# Patient Record
Sex: Female | Born: 1980 | Hispanic: Yes | Marital: Single | Smoking: Never smoker
Health system: Southern US, Community
[De-identification: ages and names within clinical notes are randomized; demographics above are authoritative.]

## PROBLEM LIST (undated history)

## (undated) DIAGNOSIS — B999 Unspecified infectious disease: Secondary | ICD-10-CM

## (undated) DIAGNOSIS — O24419 Gestational diabetes mellitus in pregnancy, unspecified control: Secondary | ICD-10-CM

## (undated) HISTORY — PX: NO PAST SURGERIES: SHX2092

## (undated) HISTORY — DX: Gestational diabetes mellitus in pregnancy, unspecified control: O24.419

---

## 2013-09-25 LAB — CHG HEMOGLOBIN ELECTROPHORESIS
GLUCOSE 1 HOUR GTT: 120 mg/dL (ref ?–200)
Hemoglobin, Elp: NORMAL
PAP SMEAR: NEGATIVE
QUAD SCREEN FOR MFM: NEGATIVE
Urine Culture, OB: NEGATIVE

## 2013-09-25 LAB — OB RESULTS CONSOLE HEPATITIS B SURFACE ANTIGEN: Hepatitis B Surface Ag: NEGATIVE

## 2013-09-25 LAB — OB RESULTS CONSOLE ABO/RH: RH Type: POSITIVE

## 2013-09-25 LAB — OB RESULTS CONSOLE HIV ANTIBODY (ROUTINE TESTING): HIV: NONREACTIVE

## 2013-09-25 LAB — OB RESULTS CONSOLE VARICELLA ZOSTER ANTIBODY, IGG: VARICELLA IGG: IMMUNE

## 2013-09-25 LAB — OB RESULTS CONSOLE HGB/HCT, BLOOD
HEMATOCRIT: 40 %
Hemoglobin: 12.6 g/dL

## 2013-09-25 LAB — OB RESULTS CONSOLE RPR: RPR: NONREACTIVE

## 2013-09-25 LAB — OB RESULTS CONSOLE GC/CHLAMYDIA
CHLAMYDIA, DNA PROBE: NEGATIVE
GC PROBE AMP, GENITAL: NEGATIVE

## 2013-09-25 LAB — OB RESULTS CONSOLE RUBELLA ANTIBODY, IGM: RUBELLA: IMMUNE

## 2013-09-25 LAB — OB RESULTS CONSOLE ANTIBODY SCREEN: Antibody Screen: NEGATIVE

## 2013-12-27 LAB — GLUCOSE TOLERANCE, 1 HOUR: GLUCOSE 1 HOUR GTT: 151 mg/dL (ref ?–200)

## 2014-01-03 ENCOUNTER — Encounter: Payer: Self-pay | Admitting: Obstetrics & Gynecology

## 2014-01-03 LAB — GLUCOSE, FASTING
GLUCOSE FASTING: 102 mg/dL (ref 60–109)
Glucose, GTT - 1 Hour: 216 mg/dL — AB (ref ?–200)

## 2014-01-09 DIAGNOSIS — O24419 Gestational diabetes mellitus in pregnancy, unspecified control: Secondary | ICD-10-CM

## 2014-01-09 NOTE — Progress Notes (Signed)
Called [facility] department to get ultrasound reports .

## 2014-01-15 ENCOUNTER — Ambulatory Visit (INDEPENDENT_AMBULATORY_CARE_PROVIDER_SITE_OTHER): Payer: Self-pay | Admitting: Obstetrics & Gynecology

## 2014-01-15 ENCOUNTER — Encounter: Payer: Self-pay | Admitting: Obstetrics & Gynecology

## 2014-01-15 ENCOUNTER — Encounter: Payer: Self-pay | Attending: Obstetrics & Gynecology | Admitting: *Deleted

## 2014-01-15 VITALS — BP 132/78 | HR 95 | Ht <= 58 in | Wt 163.3 lb

## 2014-01-15 DIAGNOSIS — Z713 Dietary counseling and surveillance: Secondary | ICD-10-CM | POA: Insufficient documentation

## 2014-01-15 DIAGNOSIS — O24419 Gestational diabetes mellitus in pregnancy, unspecified control: Secondary | ICD-10-CM

## 2014-01-15 LAB — POCT URINALYSIS DIP (DEVICE)
Bilirubin Urine: NEGATIVE
GLUCOSE, UA: 100 mg/dL — AB
KETONES UR: NEGATIVE mg/dL
NITRITE: NEGATIVE
Protein, ur: NEGATIVE mg/dL
Specific Gravity, Urine: 1.01 (ref 1.005–1.030)
Urobilinogen, UA: 0.2 mg/dL (ref 0.0–1.0)
pH: 6.5 (ref 5.0–8.0)

## 2014-01-15 NOTE — Progress Notes (Signed)
Nutrition note: 1st visit consult & GDM diet education Pt is newly diagnosed with GDM & has h/o obesity Pt has gained 13.3# @ 2067w5d, which is wnl. Pt reports eating 3 meals & 3 snacks/d. Pt is taking a PNV. Pt reports no N/V or heartburn. NKFA. Pt reports walking 30-60 mins most days. Pt received verbal & written education on GDM diet in Spanish via interpreter. Discussed benefits of BF. Discussed wt gain goals of 11-20# or 0.5#/wk. Pt agrees to follow GDM diet with 3 meals & 3 snacks/d with proper CHO/ protein combination. Pt has WIC & plans to BF. F/u in 4-6 wks Blondell RevealLaura Amillion Macchia, MS, RD, LDN, [facility]

## 2014-01-15 NOTE — Progress Notes (Signed)
Patient is Spanish-speaking only, Spanish interpreter present for this encounter. [facility]. Discussed implications of DM in pregnancy, need for optimizing glycemic control to decrease DM associated maternal-fetal morbidity and mortality, need for antenatal testing and frequent ultrasounds/prenatal visits. Will check baseline labs today, get DM education and DM testing supplies today. No other complaints or concerns.  Labor and fetal movement precautions reviewed.

## 2014-01-15 NOTE — Progress Notes (Signed)
  Patient was seen on 01/15/14 for Gestational Diabetes self-management . The following learning objectives were met by the patient :   States the definition of Gestational Diabetes  States why dietary management is important in controlling blood glucose  States when to check blood glucose levels  Demonstrates proper blood glucose monitoring techniques  States the effect of stress and exercise on blood glucose levels  Plan:  Consider  increasing your activity level by walking daily as tolerated Begin checking BG before breakfast and 2 hours after first bit of breakfast, lunch and dinner after  as directed by MD  Take medication  as directed by MD  Blood glucose monitor given: [employer] Track Lot # [id number] Exp: 2016/03/14 Blood glucose reading: FBS $RemoveBefo'118mg'[id number]$ /dl  Patient instructed to monitor glucose levels: FBS: 60 - <90 2 hour: <120  Patient received the following handouts:  Nutrition Diabetes and Pregnancy  Patient will be seen for follow-up as needed.

## 2014-01-15 NOTE — Patient Instructions (Signed)
Regrese a la clinica cuando tenga su cita. Si tiene problemas o preguntas, llama a la clinica o vaya a la sal[facility] al [facility].    

## 2014-01-30 ENCOUNTER — Encounter: Payer: Self-pay | Admitting: Obstetrics and Gynecology

## 2014-01-30 ENCOUNTER — Ambulatory Visit (INDEPENDENT_AMBULATORY_CARE_PROVIDER_SITE_OTHER): Payer: Self-pay | Admitting: Obstetrics and Gynecology

## 2014-01-30 VITALS — BP 124/65 | HR 83 | Temp 98.1°F | Wt 161.7 lb

## 2014-01-30 DIAGNOSIS — O09513 Supervision of elderly primigravida, third trimester: Secondary | ICD-10-CM

## 2014-01-30 DIAGNOSIS — O24419 Gestational diabetes mellitus in pregnancy, unspecified control: Secondary | ICD-10-CM

## 2014-01-30 DIAGNOSIS — O09519 Supervision of elderly primigravida, unspecified trimester: Secondary | ICD-10-CM | POA: Insufficient documentation

## 2014-01-30 DIAGNOSIS — E669 Obesity, unspecified: Secondary | ICD-10-CM

## 2014-01-30 LAB — POCT URINALYSIS DIP (DEVICE)
Bilirubin Urine: NEGATIVE
Glucose, UA: NEGATIVE mg/dL
KETONES UR: NEGATIVE mg/dL
LEUKOCYTES UA: NEGATIVE
Nitrite: NEGATIVE
Protein, ur: NEGATIVE mg/dL
Specific Gravity, Urine: <=1.005 (ref 1.005–1.030)
Urobilinogen, UA: 0.2 mg/dL (ref 0.0–1.0)
pH: 5 (ref 5.0–8.0)

## 2014-01-30 MED ORDER — GLYBURIDE 2.5 MG PO TABS: ORAL_TABLET | ORAL | Status: DC

## 2014-01-30 NOTE — Progress Notes (Signed)
Ultrasound scheduled with MFM on Friday 02/02/14 at 0815.

## 2014-01-30 NOTE — Progress Notes (Signed)
Interpreter here. Records reviewed. [facility] Had 1 hr GCT at [facility] in July of 120. Recent 1 hr  GCT was 215.  Following [facility] restricted diet and qid CBG checks since 01/15/14: All fastings elevated except 1 (86-140, most 110-120). Postparandials mostly within range 91-122 with outlyer x2 (170, 143 after dinner). Discussed initiating glyburide with Dr. Jolayne Pantheronstant> start 1.25 mg qhs. Complete US with BPP. Friday and start 2x/wk fetal testing next week. F/U Monday with DM nurse. Reviewed diet.

## 2014-01-30 NOTE — Progress Notes (Signed)
Cassie RedoAdriana Zavala Thornton used as interpreter for this encounter.

## 2014-02-02 ENCOUNTER — Encounter (HOSPITAL_COMMUNITY): Payer: Self-pay | Admitting: Obstetrics and Gynecology

## 2014-02-02 ENCOUNTER — Ambulatory Visit (HOSPITAL_COMMUNITY)
Admission: RE | Admit: 2014-02-02 | Discharge: 2014-02-02 | Disposition: A | Discharge status: Home / Self Care | Payer: Self-pay | Source: Ambulatory Visit | Attending: Obstetrics and Gynecology | Admitting: Obstetrics and Gynecology

## 2014-02-02 VITALS — BP 147/79 | HR 84 | Wt 164.2 lb

## 2014-02-02 DIAGNOSIS — O24419 Gestational diabetes mellitus in pregnancy, unspecified control: Secondary | ICD-10-CM

## 2014-02-02 DIAGNOSIS — O24410 Gestational diabetes mellitus in pregnancy, diet controlled: Secondary | ICD-10-CM | POA: Insufficient documentation

## 2014-02-02 DIAGNOSIS — O09513 Supervision of elderly primigravida, third trimester: Secondary | ICD-10-CM

## 2014-02-02 DIAGNOSIS — Z3A32 32 weeks gestation of pregnancy: Secondary | ICD-10-CM | POA: Insufficient documentation

## 2014-02-02 LAB — US OP OB COMP + 14 WK

## 2014-02-05 ENCOUNTER — Ambulatory Visit (INDEPENDENT_AMBULATORY_CARE_PROVIDER_SITE_OTHER): Payer: Self-pay | Admitting: Obstetrics & Gynecology

## 2014-02-05 VITALS — BP 129/73 | HR 96 | Temp 98.0°F | Wt 162.2 lb

## 2014-02-05 DIAGNOSIS — O24419 Gestational diabetes mellitus in pregnancy, unspecified control: Secondary | ICD-10-CM

## 2014-02-05 LAB — POCT URINALYSIS DIP (DEVICE)
BILIRUBIN URINE: NEGATIVE
GLUCOSE, UA: 100 mg/dL — AB
Nitrite: NEGATIVE
Protein, ur: 30 mg/dL — AB
SPECIFIC GRAVITY, URINE: 1.025 (ref 1.005–1.030)
Urobilinogen, UA: 0.2 mg/dL (ref 0.0–1.0)
pH: 6.5 (ref 5.0–8.0)

## 2014-02-05 NOTE — Progress Notes (Signed)
Fastings 102,100,98,92,90,90,90;  All pp are less than 100.  Pt encouraged to take a walk and eat bedtime snack.  If fastings consistently above 90 will increase to 2.5 mg of glyburide.   Will send Urine culture for hgb, leuks and protein in urine.

## 2014-02-05 NOTE — Addendum Note (Signed)
Addended by: Jill SideAY, DIANE L on: 02/05/2014 12:09 PM   Modules accepted: Level of Service

## 2014-02-05 NOTE — Progress Notes (Addendum)
Nutrition note: f/u GDM diet Pt has gained 12.2# @ 636w6d, which is wnl. Pt's BS- fasting: 90-100, 2hr pp: 87-102. Pt reports eating her last snack ~7pm & has 1 cup of milk, piece of fruit & piece of bread. Explained to pt that she should try to only consume 2 servings of CHO at her snack before bedtime. Reviewed CHO foods vs non-CHO foods. Pt agreeable. F/u as needed Cassie Thornton RevealLaura Annajulia Lewing, MS, RD, LDN, [facility]

## 2014-02-05 NOTE — Progress Notes (Signed)
Pt had US w/ BPP 8/8 on 12/4.

## 2014-02-08 ENCOUNTER — Other Ambulatory Visit: Payer: Self-pay | Admitting: Obstetrics & Gynecology

## 2014-02-08 ENCOUNTER — Ambulatory Visit (INDEPENDENT_AMBULATORY_CARE_PROVIDER_SITE_OTHER): Payer: Self-pay | Admitting: *Deleted

## 2014-02-08 VITALS — BP 126/79 | HR 76

## 2014-02-08 DIAGNOSIS — O24419 Gestational diabetes mellitus in pregnancy, unspecified control: Secondary | ICD-10-CM

## 2014-02-08 LAB — US OB FOLLOW UP

## 2014-02-08 MED ORDER — AMOXICILLIN 500 MG PO CAPS: 500.0000 mg | ORAL_CAPSULE | Freq: Three times a day (TID) | ORAL | Status: AC

## 2014-02-09 LAB — CULTURE, OB URINE

## 2014-02-12 ENCOUNTER — Encounter: Payer: Self-pay | Attending: Obstetrics & Gynecology | Admitting: *Deleted

## 2014-02-12 ENCOUNTER — Encounter: Payer: Self-pay | Admitting: *Deleted

## 2014-02-12 ENCOUNTER — Ambulatory Visit (INDEPENDENT_AMBULATORY_CARE_PROVIDER_SITE_OTHER): Payer: Self-pay | Admitting: Obstetrics & Gynecology

## 2014-02-12 VITALS — BP 138/83 | HR 98 | Wt 164.5 lb

## 2014-02-12 DIAGNOSIS — O24419 Gestational diabetes mellitus in pregnancy, unspecified control: Secondary | ICD-10-CM | POA: Insufficient documentation

## 2014-02-12 DIAGNOSIS — Z713 Dietary counseling and surveillance: Secondary | ICD-10-CM | POA: Insufficient documentation

## 2014-02-12 LAB — POCT URINALYSIS DIP (DEVICE)
BILIRUBIN URINE: NEGATIVE
GLUCOSE, UA: NEGATIVE mg/dL
Ketones, ur: NEGATIVE mg/dL
NITRITE: NEGATIVE
Protein, ur: NEGATIVE mg/dL
Specific Gravity, Urine: 1.01 (ref 1.005–1.030)
Urobilinogen, UA: 0.2 mg/dL (ref 0.0–1.0)
pH: 7 (ref 5.0–8.0)

## 2014-02-12 MED ORDER — PRENATAL 27-0.8 MG PO TABS: 1.0000 | ORAL_TABLET | Freq: Every day | ORAL | Status: AC

## 2014-02-12 NOTE — Progress Notes (Signed)
Pt informed today of UTI from last week and will obtain amoxicillin from pharmacy today.

## 2014-02-12 NOTE — Progress Notes (Signed)
Pt here for prenatal visit.  She was advised of UTI and will obtain Rx today.

## 2014-02-12 NOTE — Patient Instructions (Signed)
Return to clinic for any obstetric concerns or go to MAU for evaluation  

## 2014-02-12 NOTE — Progress Notes (Signed)
With the assistance of Spanish interpreter, Reviewed log vs. Meter numbers. Log noted a FBs for this morning of 65mg /dl. There was no reading for this date in the meter. Patient demonstrated testing for me.2hpp reading of 112mg /dl. Reviewed technique, process, testing schedule and identified what number was to be logged in what location. Patient verbalized a better understanding and noted that she did not actually understand what she was to be doing. I anticipate accurate testing and documentation in the future. I requested to see patient next week to review readings.

## 2014-02-12 NOTE — Progress Notes (Signed)
Patient is Spanish-speaking only, Spanish interpreter present for this encounter. On review of blood sugars; all CBGs noted were 60-90s for all values. On review of her meter; the following is seen (times in italics): 12/6 95 8:47 12/7 146 4:50 12/10 185 12:50  91 7:15 134 11:07 12/13 132 9:07 Patient asked about the discrepancy among values in her book and her meter; she maintains that she wrote what she saw on meter She was advised that appropriate treatment of GDM relies on correct values; she may not be getting adequately treated and this can increase maternal/fetal morbidity and mortality. Patient will see DM educator today to reinforce proper CBG checking.  NST performed today was reviewed and was found to be reactive.  Continue recommended antenatal testing and prenatal care. No other complaints or concerns.  Labor and fetal movement precautions reviewed.

## 2014-02-13 ENCOUNTER — Other Ambulatory Visit: Payer: Self-pay | Admitting: Obstetrics & Gynecology

## 2014-02-13 MED ORDER — AMPICILLIN 500 MG PO CAPS: 500.0000 mg | ORAL_CAPSULE | Freq: Four times a day (QID) | ORAL | Status: AC

## 2014-02-13 NOTE — Progress Notes (Signed)
Enterococcus UTI.  Rx with Ampicillin.

## 2014-02-14 NOTE — Progress Notes (Signed)
Pt has scheduled clinic appt on 12/17 and will be informed of UTI and treatment prescribed at appt.

## 2014-02-15 ENCOUNTER — Ambulatory Visit (INDEPENDENT_AMBULATORY_CARE_PROVIDER_SITE_OTHER): Payer: Self-pay | Admitting: *Deleted

## 2014-02-15 ENCOUNTER — Ambulatory Visit (HOSPITAL_COMMUNITY)
Admission: RE | Admit: 2014-02-15 | Discharge: 2014-02-15 | Disposition: A | Discharge status: Home / Self Care | Payer: Self-pay | Source: Ambulatory Visit | Attending: Obstetrics & Gynecology | Admitting: Obstetrics & Gynecology

## 2014-02-15 VITALS — BP 147/85 | HR 88 | Wt 165.0 lb

## 2014-02-15 DIAGNOSIS — O24410 Gestational diabetes mellitus in pregnancy, diet controlled: Secondary | ICD-10-CM | POA: Insufficient documentation

## 2014-02-15 DIAGNOSIS — Z3A34 34 weeks gestation of pregnancy: Secondary | ICD-10-CM | POA: Insufficient documentation

## 2014-02-15 DIAGNOSIS — O09513 Supervision of elderly primigravida, third trimester: Secondary | ICD-10-CM | POA: Insufficient documentation

## 2014-02-15 DIAGNOSIS — O24419 Gestational diabetes mellitus in pregnancy, unspecified control: Secondary | ICD-10-CM

## 2014-02-15 LAB — POCT URINALYSIS DIP (DEVICE)
BILIRUBIN URINE: NEGATIVE
Glucose, UA: NEGATIVE mg/dL
Ketones, ur: NEGATIVE mg/dL
LEUKOCYTES UA: NEGATIVE
NITRITE: POSITIVE — AB
PROTEIN: NEGATIVE mg/dL
Specific Gravity, Urine: <=1.005 (ref 1.005–1.030)
Urobilinogen, UA: 0.2 mg/dL (ref 0.0–1.0)
pH: 5.5 (ref 5.0–8.0)

## 2014-02-15 NOTE — Progress Notes (Signed)
Nonreactive NST today in clinic sent for BPP.  Will follow up results and also follow up AFI.

## 2014-02-16 DIAGNOSIS — Z3A34 34 weeks gestation of pregnancy: Secondary | ICD-10-CM | POA: Insufficient documentation

## 2014-02-19 ENCOUNTER — Ambulatory Visit (INDEPENDENT_AMBULATORY_CARE_PROVIDER_SITE_OTHER): Payer: Self-pay | Admitting: Obstetrics and Gynecology

## 2014-02-19 ENCOUNTER — Ambulatory Visit (HOSPITAL_COMMUNITY)
Admission: RE | Admit: 2014-02-19 | Discharge: 2014-02-19 | Disposition: A | Discharge status: Home / Self Care | Payer: Self-pay | Source: Ambulatory Visit | Attending: Obstetrics and Gynecology | Admitting: Obstetrics and Gynecology

## 2014-02-19 ENCOUNTER — Encounter: Payer: Self-pay | Admitting: Obstetrics and Gynecology

## 2014-02-19 VITALS — BP 118/79 | HR 101 | Wt 164.5 lb

## 2014-02-19 DIAGNOSIS — E669 Obesity, unspecified: Secondary | ICD-10-CM

## 2014-02-19 DIAGNOSIS — O09513 Supervision of elderly primigravida, third trimester: Secondary | ICD-10-CM

## 2014-02-19 DIAGNOSIS — O24419 Gestational diabetes mellitus in pregnancy, unspecified control: Secondary | ICD-10-CM | POA: Insufficient documentation

## 2014-02-19 NOTE — Progress Notes (Signed)
Patient is doing well without complaints. FM/PTL precautions reviewed. Patient misplaced her glucometer last week and has not been checking her CBGs. Prior to that, she was only checking them twice daily. Education provided. Will continue current glyburide regimen. NST reviewed and reactive

## 2014-02-19 NOTE — Progress Notes (Signed)
Interpreter - Cassie Thornton. Pt endorses occasional H/A but no different than when not pregnant. Pt denies visual disturbances.  *Note - pt was in semi fowlers position for first BP reading today.  Second reading was in full upright sitting position. BPP scheduled today.

## 2014-02-20 DIAGNOSIS — O09519 Supervision of elderly primigravida, unspecified trimester: Secondary | ICD-10-CM | POA: Insufficient documentation

## 2014-02-26 ENCOUNTER — Encounter: Payer: Self-pay | Admitting: Obstetrics and Gynecology

## 2014-02-26 ENCOUNTER — Ambulatory Visit (INDEPENDENT_AMBULATORY_CARE_PROVIDER_SITE_OTHER): Payer: Self-pay | Admitting: Obstetrics and Gynecology

## 2014-02-26 ENCOUNTER — Other Ambulatory Visit: Payer: Self-pay | Admitting: Obstetrics and Gynecology

## 2014-02-26 VITALS — BP 148/90 | HR 89 | Wt 167.6 lb

## 2014-02-26 DIAGNOSIS — O09513 Supervision of elderly primigravida, third trimester: Secondary | ICD-10-CM

## 2014-02-26 DIAGNOSIS — E669 Obesity, unspecified: Secondary | ICD-10-CM

## 2014-02-26 DIAGNOSIS — O24419 Gestational diabetes mellitus in pregnancy, unspecified control: Secondary | ICD-10-CM

## 2014-02-26 LAB — POCT URINALYSIS DIP (DEVICE)
Bilirubin Urine: NEGATIVE
GLUCOSE, UA: NEGATIVE mg/dL
KETONES UR: NEGATIVE mg/dL
Nitrite: NEGATIVE
Protein, ur: NEGATIVE mg/dL
Urobilinogen, UA: 0.2 mg/dL (ref 0.0–1.0)
pH: 5 (ref 5.0–8.0)

## 2014-02-26 LAB — US OB FOLLOW UP

## 2014-02-26 NOTE — Progress Notes (Signed)
Growth U/S 03/12/14 @ 815a with Radiology.

## 2014-02-26 NOTE — Progress Notes (Signed)
Patient is doing well. She denies HA, visual disturbances/RUQ/epigastric pain. She reports occasional headaches with blurry vision which both resolve within the hour. Advised to monitor these symptoms closely. Will collect 24 hour urine collection and labs. Patient did not bring CBG log or meter- she reports highest fasting 94 and highest pp 202 (she states that most are in the 120's) Will schedule follow up growth ultrasound Cultures collected NST reviewed and reactive

## 2014-02-26 NOTE — Progress Notes (Signed)
Pt denies H/A or visual disturbances today but endorses occasional H/A's and blurry vision. Interpreter today - Elna Breslowarol Hernandez.

## 2014-02-27 LAB — GC/CHLAMYDIA PROBE AMP
CT Probe RNA: NEGATIVE
GC PROBE AMP APTIMA: NEGATIVE

## 2014-02-28 LAB — CULTURE, BETA STREP (GROUP B ONLY)

## 2014-03-01 ENCOUNTER — Encounter (HOSPITAL_COMMUNITY): Payer: Self-pay | Admitting: *Deleted

## 2014-03-01 ENCOUNTER — Inpatient Hospital Stay (HOSPITAL_COMMUNITY)
Admission: AD | Admit: 2014-03-01 | Discharge: 2014-03-04 | DRG: 775 | Disposition: A | Discharge status: Home / Self Care | Payer: Medicaid Other | Source: Ambulatory Visit | Attending: Obstetrics and Gynecology | Admitting: Obstetrics and Gynecology

## 2014-03-01 ENCOUNTER — Other Ambulatory Visit: Payer: Self-pay

## 2014-03-01 DIAGNOSIS — Z3A36 36 weeks gestation of pregnancy: Secondary | ICD-10-CM | POA: Diagnosis present

## 2014-03-01 DIAGNOSIS — O42913 Preterm premature rupture of membranes, unspecified as to length of time between rupture and onset of labor, third trimester: Secondary | ICD-10-CM | POA: Diagnosis present

## 2014-03-01 DIAGNOSIS — O24419 Gestational diabetes mellitus in pregnancy, unspecified control: Secondary | ICD-10-CM

## 2014-03-01 DIAGNOSIS — O42919 Preterm premature rupture of membranes, unspecified as to length of time between rupture and onset of labor, unspecified trimester: Secondary | ICD-10-CM | POA: Diagnosis present

## 2014-03-01 DIAGNOSIS — O24429 Gestational diabetes mellitus in childbirth, unspecified control: Secondary | ICD-10-CM | POA: Diagnosis not present

## 2014-03-01 DIAGNOSIS — O24420 Gestational diabetes mellitus in childbirth, diet controlled: Secondary | ICD-10-CM | POA: Diagnosis present

## 2014-03-01 DIAGNOSIS — O09513 Supervision of elderly primigravida, third trimester: Secondary | ICD-10-CM | POA: Diagnosis not present

## 2014-03-01 HISTORY — DX: Unspecified infectious disease: B99.9

## 2014-03-01 LAB — TYPE AND SCREEN
ABO/RH(D): A POS
ANTIBODY SCREEN: NEGATIVE

## 2014-03-01 LAB — GLUCOSE, CAPILLARY
GLUCOSE-CAPILLARY: 79 mg/dL (ref 70–99)
Glucose-Capillary: 82 mg/dL (ref 70–99)
Glucose-Capillary: 103 mg/dL — ABNORMAL HIGH (ref 70–99)

## 2014-03-01 LAB — POCT FERN TEST: POCT Fern Test: POSITIVE

## 2014-03-01 LAB — CBC
HEMATOCRIT: 35.9 % — AB (ref 36.0–46.0)
Hemoglobin: 11.7 g/dL — ABNORMAL LOW (ref 12.0–15.0)
MCH: 26.6 pg (ref 26.0–34.0)
MCHC: 32.6 g/dL (ref 30.0–36.0)
MCV: 81.6 fL (ref 78.0–100.0)
PLATELETS: 386 10*3/uL (ref 150–400)
RBC: 4.4 MIL/uL (ref 3.87–5.11)
RDW: 16 % — ABNORMAL HIGH (ref 11.5–15.5)
WBC: 12.1 10*3/uL — ABNORMAL HIGH (ref 4.0–10.5)

## 2014-03-01 LAB — OB RESULTS CONSOLE GBS: GBS: NEGATIVE

## 2014-03-01 LAB — HIV ANTIBODY (ROUTINE TESTING W REFLEX): HIV: NONREACTIVE

## 2014-03-01 LAB — ABO/RH: ABO/RH(D): A POS

## 2014-03-01 LAB — GROUP B STREP BY PCR: Group B strep by PCR: NEGATIVE

## 2014-03-01 LAB — RPR

## 2014-03-01 MED ORDER — LACTATED RINGERS IV SOLN
500.0000 mL | INTRAVENOUS | Status: DC | PRN
  Administered 2014-03-01: 1000 mL via INTRAVENOUS

## 2014-03-01 MED ORDER — INFLUENZA VAC SPLIT QUAD 0.5 ML IM SUSY: 0.5000 mL | PREFILLED_SYRINGE | INTRAMUSCULAR | Status: DC

## 2014-03-01 MED ORDER — OXYTOCIN 40 UNITS IN LACTATED RINGERS INFUSION - SIMPLE MED
1.0000 m[IU]/min | INTRAVENOUS | Status: DC
  Administered 2014-03-01: 2 m[IU]/min via INTRAVENOUS
  Filled 2014-03-01: qty 1000

## 2014-03-01 MED ORDER — OXYTOCIN 40 UNITS IN LACTATED RINGERS INFUSION - SIMPLE MED
62.5000 mL/h | INTRAVENOUS | Status: DC
  Administered 2014-03-02: 62.5 mL/h via INTRAVENOUS

## 2014-03-01 MED ORDER — OXYCODONE-ACETAMINOPHEN 5-325 MG PO TABS: 1.0000 | ORAL_TABLET | ORAL | Status: DC | PRN

## 2014-03-01 MED ORDER — LACTATED RINGERS IV SOLN
INTRAVENOUS | Status: DC
  Administered 2014-03-01: 13:00:00 via INTRAVENOUS

## 2014-03-01 MED ORDER — CITRIC ACID-SODIUM CITRATE 334-500 MG/5ML PO SOLN: 30.0000 mL | ORAL | Status: DC | PRN

## 2014-03-01 MED ORDER — MISOPROSTOL 200 MCG PO TABS
50.0000 ug | ORAL_TABLET | ORAL | Status: DC | PRN
  Administered 2014-03-01 (×3): 50 ug via ORAL
  Filled 2014-03-01 (×3): qty 0.5

## 2014-03-01 MED ORDER — ACETAMINOPHEN 325 MG PO TABS: 650.0000 mg | ORAL_TABLET | ORAL | Status: DC | PRN

## 2014-03-01 MED ORDER — OXYTOCIN BOLUS FROM INFUSION
500.0000 mL | INTRAVENOUS | Status: DC
  Administered 2014-03-02: 500 mL via INTRAVENOUS

## 2014-03-01 MED ORDER — OXYCODONE-ACETAMINOPHEN 5-325 MG PO TABS: 2.0000 | ORAL_TABLET | ORAL | Status: DC | PRN

## 2014-03-01 MED ORDER — ONDANSETRON HCL 4 MG/2ML IJ SOLN: 4.0000 mg | Freq: Four times a day (QID) | INTRAMUSCULAR | Status: DC | PRN

## 2014-03-01 MED ORDER — TERBUTALINE SULFATE 1 MG/ML IJ SOLN: 0.2500 mg | Freq: Once | INTRAMUSCULAR | Status: AC | PRN

## 2014-03-01 MED ORDER — LIDOCAINE HCL (PF) 1 % IJ SOLN
30.0000 mL | INTRAMUSCULAR | Status: DC | PRN
  Filled 2014-03-01: qty 30

## 2014-03-01 NOTE — Progress Notes (Signed)
LABOR PROGRESS NOTE  Cassie Thornton is a 33 y.o. G1P0 at 2876w1d  admitted for rupture of membranes  Subjective: comfortable  Objective: BP 125/69 mmHg  Pulse 80  Temp(Src) 98 F (36.7 C) (Oral)  Resp 16  Ht 4\' 9"  (1.448 m)  Wt 169 lb (76.658 kg)  BMI 36.56 kg/m2  LMP 06/21/2013 or  Filed Vitals:   03/01/14 0737 03/01/14 0817 03/01/14 1025 03/01/14 1309  BP: 142/79 143/88 133/70 125/69  Pulse: 107 101 86 80  Temp:   98 F (36.7 C)   TempSrc:      Resp:   16   Height:      Weight:           FHT:  FHR: 140 bpm, variability: moderate,  accelerations:  Present,  decelerations:  Absent UC:   q7 min SVE:   Dilation: 2 Effacement (%): 20 Station: -3 Exam by:: Trixy Loyola  Dilation: 2 Effacement (%): 20 Cervical Position: Middle Station: -3 Presentation: Vertex Exam by:: Valoria Tamburri  Labs: Lab Results  Component Value Date   WBC 12.1* 03/01/2014   HGB 11.7* 03/01/2014   HCT 35.9* 03/01/2014   MCV 81.6 03/01/2014   PLT 386 03/01/2014    Assessment / Plan: IOL 2/2 PPROM  Labor: Progressing normally, FB placed at 1430, 2nd dose of cytotec to be given Fetal Wellbeing:  Category I Pain Control:  Labor support without medications Anticipated MOD:  NSVD  Kalie Cabral ROCIO, MD 03/01/2014, 2:47 PM

## 2014-03-01 NOTE — Progress Notes (Signed)
I stopped by patients room to check on her needs , I ordered breakfast for next day, by Orlan LeavensViria Alvarez, Interpreter

## 2014-03-01 NOTE — MAU Note (Signed)
Started leaking clear fluid at 0530, still coming. Small amt of bleeding. Some pains

## 2014-03-01 NOTE — H&P (Signed)
Cassie Thornton is a 33 y.o. female presenting for leaking fluid.  Has some mild contractions. + fetal movement.  Denies bleeding. Cassie Thornton here for interpretation  Maternal Medical History:  Reason for admission: Nausea.    OB History    Gravida Para Term Preterm AB TAB SAB Ectopic Multiple Living   1              Past Medical History  Diagnosis Date  . Gestational diabetes   . Infection     UTI   Past Surgical History  Procedure Laterality Date  . No past surgeries     Family History: family history is negative for Diabetes, Hypertension, Cancer, Heart disease, Stroke, and Hearing loss. Social History:  reports that she has never smoked. She has never used smokeless tobacco. She reports that she does not drink alcohol or use illicit drugs.   Prenatal Transfer Tool  Maternal Diabetes: Yes:  Diabetes Type:  Insulin/Medication controlled Genetic Screening: Normal Maternal Ultrasounds/Referrals: Normal Fetal Ultrasounds or other Referrals:  None Maternal Substance Abuse:  No Significant Maternal Medications:  Meds include: Other:  Glyburide Significant Maternal Lab Results:  None Other Comments:  GBS by PCR sent today  Review of Systems  Constitutional: Negative for fever, chills and malaise/fatigue.  Gastrointestinal: Negative for nausea, vomiting, abdominal pain, diarrhea and constipation.  Genitourinary: Negative for dysuria.       Leaking clear fluid     Dilation:  (unable to reach) Blood pressure 142/79, pulse 107, temperature 98.6 F (37 C), temperature source Oral, resp. rate 18, height 4\' 9"  (1.448 m), weight 169 lb (76.658 kg), last menstrual period 06/21/2013. Maternal Exam:  Uterine Assessment: Contraction strength is mild.  Contraction frequency is irregular.   Abdomen: Fundal height is 36.   Estimated fetal weight is 6.   Fetal presentation: vertex  Introitus: Normal vulva. Vagina is positive for vaginal discharge.  Ferning test: positive.   Nitrazine test: not done. Amniotic fluid character: clear.  Pelvis: adequate for delivery.   Cervix: Cervix evaluated by digital exam.     Fetal Exam Fetal Monitor Review: Mode: ultrasound.   Baseline rate: 140.  Variability: moderate (6-25 bpm).   Pattern: accelerations present and no decelerations.    Fetal State Assessment: Category I - tracings are normal.     Physical Exam  Constitutional: She is oriented to person, place, and time. She appears well-developed and well-nourished. No distress.  HENT:  Head: Normocephalic.  Cardiovascular: Normal rate and regular rhythm.  Exam reveals no gallop and no friction rub.   No murmur heard. Respiratory: Effort normal.  GI: Soft. She exhibits no distension and no mass. There is no tenderness. There is no rebound and no guarding.  Genitourinary: Vaginal discharge found.  Dilation:  (unable to reach) Cervical Position: Posterior   Musculoskeletal: Normal range of motion.  Neurological: She is alert and oriented to person, place, and time.  Skin: Skin is warm and dry.  Psychiatric: She has a normal mood and affect.    Prenatal labs: ABO, Rh: A/Positive/-- (07/27 0000) Antibody: Negative (07/27 0000) Rubella: Immune (07/27 0000) RPR: Nonreactive (07/27 0000)  HBsAg: Negative (07/27 0000)  HIV: Non-reactive (07/27 0000)  GBS:   Collected  Assessment/Plan: A:  SIUP at 5329w1d       PPROM      GDM, Glyburide   P   [facility]      Routine orders      GBS by PCR sent  [facility],Cassie Thornton 03/01/2014, 8:19 AM

## 2014-03-01 NOTE — Progress Notes (Signed)
I assisted Jolynn, RN with questions. Eda H Royal  Interpreter.

## 2014-03-01 NOTE — Progress Notes (Signed)
I assisted Vernona RiegerLaura, RN with explanation of care plan.Kipp Laurence. Eda H Royal Interpreter.

## 2014-03-02 ENCOUNTER — Encounter (HOSPITAL_COMMUNITY): Payer: Self-pay | Admitting: General Practice

## 2014-03-02 DIAGNOSIS — Z3A36 36 weeks gestation of pregnancy: Secondary | ICD-10-CM

## 2014-03-02 DIAGNOSIS — Z79899 Other long term (current) drug therapy: Secondary | ICD-10-CM

## 2014-03-02 DIAGNOSIS — O24429 Gestational diabetes mellitus in childbirth, unspecified control: Secondary | ICD-10-CM

## 2014-03-02 LAB — GLUCOSE, CAPILLARY
GLUCOSE-CAPILLARY: 166 mg/dL — AB (ref 70–99)
Glucose-Capillary: 143 mg/dL — ABNORMAL HIGH (ref 70–99)

## 2014-03-02 MED ORDER — SIMETHICONE 80 MG PO CHEW: 80.0000 mg | CHEWABLE_TABLET | ORAL | Status: DC | PRN

## 2014-03-02 MED ORDER — FENTANYL CITRATE 0.05 MG/ML IJ SOLN
INTRAMUSCULAR | Status: AC
  Administered 2014-03-02: 100 ug
  Filled 2014-03-02: qty 2

## 2014-03-02 MED ORDER — SODIUM CHLORIDE 0.9 % IV SOLN
INTRAVENOUS | Status: DC
  Filled 2014-03-02: qty 2.5

## 2014-03-02 MED ORDER — WITCH HAZEL-GLYCERIN EX PADS
1.0000 | MEDICATED_PAD | CUTANEOUS | Status: DC | PRN
Start: 2014-03-02 — End: 2014-03-04

## 2014-03-02 MED ORDER — BENZOCAINE-MENTHOL 20-0.5 % EX AERO
1.0000 "application " | INHALATION_SPRAY | CUTANEOUS | Status: DC | PRN
  Administered 2014-03-03: 1 via TOPICAL
  Filled 2014-03-02: qty 56

## 2014-03-02 MED ORDER — DIPHENHYDRAMINE HCL 25 MG PO CAPS: 25.0000 mg | ORAL_CAPSULE | Freq: Four times a day (QID) | ORAL | Status: DC | PRN

## 2014-03-02 MED ORDER — SODIUM CHLORIDE 0.9 % IJ SOLN: 3.0000 mL | INTRAMUSCULAR | Status: DC | PRN

## 2014-03-02 MED ORDER — INSULIN REGULAR HUMAN 100 UNIT/ML IJ SOLN
3.0000 [IU] | Freq: Once | INTRAMUSCULAR | Status: DC
  Filled 2014-03-02: qty 0.03

## 2014-03-02 MED ORDER — BISACODYL 10 MG RE SUPP: 10.0000 mg | Freq: Every day | RECTAL | Status: DC | PRN

## 2014-03-02 MED ORDER — ONDANSETRON HCL 4 MG/2ML IJ SOLN: 4.0000 mg | INTRAMUSCULAR | Status: DC | PRN

## 2014-03-02 MED ORDER — OXYTOCIN 40 UNITS IN LACTATED RINGERS INFUSION - SIMPLE MED: 62.5000 mL/h | INTRAVENOUS | Status: DC | PRN

## 2014-03-02 MED ORDER — SENNOSIDES-DOCUSATE SODIUM 8.6-50 MG PO TABS
2.0000 | ORAL_TABLET | ORAL | Status: DC
  Administered 2014-03-03 (×2): 2 via ORAL
  Filled 2014-03-02 (×2): qty 2

## 2014-03-02 MED ORDER — SODIUM CHLORIDE 0.9 % IV SOLN: 250.0000 mL | INTRAVENOUS | Status: DC | PRN

## 2014-03-02 MED ORDER — DIBUCAINE 1 % RE OINT: 1.0000 "application " | TOPICAL_OINTMENT | RECTAL | Status: DC | PRN

## 2014-03-02 MED ORDER — INSULIN ASPART 100 UNIT/ML ~~LOC~~ SOLN: 0.0000 [IU] | Freq: Once | SUBCUTANEOUS | Status: DC

## 2014-03-02 MED ORDER — SODIUM CHLORIDE 0.9 % IJ SOLN: 3.0000 mL | Freq: Two times a day (BID) | INTRAMUSCULAR | Status: DC

## 2014-03-02 MED ORDER — FLEET ENEMA 7-19 GM/118ML RE ENEM: 1.0000 | ENEMA | Freq: Every day | RECTAL | Status: DC | PRN

## 2014-03-02 MED ORDER — LANOLIN HYDROUS EX OINT: TOPICAL_OINTMENT | CUTANEOUS | Status: DC | PRN

## 2014-03-02 MED ORDER — OXYCODONE-ACETAMINOPHEN 5-325 MG PO TABS
1.0000 | ORAL_TABLET | ORAL | Status: DC | PRN
  Administered 2014-03-02: 1 via ORAL
  Filled 2014-03-02: qty 1

## 2014-03-02 MED ORDER — FENTANYL CITRATE 0.05 MG/ML IJ SOLN: 100.0000 ug | INTRAMUSCULAR | Status: DC | PRN

## 2014-03-02 MED ORDER — ONDANSETRON HCL 4 MG PO TABS: 4.0000 mg | ORAL_TABLET | ORAL | Status: DC | PRN

## 2014-03-02 MED ORDER — ZOLPIDEM TARTRATE 5 MG PO TABS: 5.0000 mg | ORAL_TABLET | Freq: Every evening | ORAL | Status: DC | PRN

## 2014-03-02 MED ORDER — PRENATAL MULTIVITAMIN CH
1.0000 | ORAL_TABLET | Freq: Every day | ORAL | Status: DC
  Administered 2014-03-02 – 2014-03-04 (×3): 1 via ORAL
  Filled 2014-03-02 (×3): qty 1

## 2014-03-02 MED ORDER — IBUPROFEN 600 MG PO TABS
600.0000 mg | ORAL_TABLET | Freq: Four times a day (QID) | ORAL | Status: DC
  Administered 2014-03-02 – 2014-03-04 (×9): 600 mg via ORAL
  Filled 2014-03-02 (×9): qty 1

## 2014-03-02 NOTE — Progress Notes (Signed)
I assisted [place] RN and Dr Loreta Ave with some questions, by Orlan Leavens- Interpreter

## 2014-03-02 NOTE — Progress Notes (Signed)
I ordered patient's lunch.  Cassie Thornton  Interpreter. °

## 2014-03-02 NOTE — L&D Delivery Note (Signed)
Patient is 34 y.o. G81P0101 [redacted]w[redacted]d admitted with premature rupture of membranes, A2DM on glyburide with recent glucose prior to delivery of 167 after having had apple juice, glucose otherwise wnl    Delivery Note At 7:11 AM a viable female was delivered via Vaginal, Spontaneous Delivery (Presentation: Left Occiput Anterior).  APGAR: 7, 9; weight 5 lb 2.9 oz (2350 g).   Placenta status: Intact, Spontaneous.  Cord: 3 vessels with the following complications: None.  Anesthesia: None  Episiotomy: None Lacerations: None Suture Repair: n/a Est. Blood Loss (mL): 100  Mom to postpartum.  Baby to Couplet care / Skin to Skin.  Neena Beecham ROCIO 03/02/2014, 9:31 AM

## 2014-03-02 NOTE — Progress Notes (Signed)
Stopped by to check on patient and ordered her Breakfast.  Cassie Thornton Interpreter.

## 2014-03-02 NOTE — Progress Notes (Signed)
Pt complained of upper back/shoulder pain, level 7.  Requested stronger pain medicine. Called TS Selena Batten); will put in order for percocet.

## 2014-03-02 NOTE — Progress Notes (Signed)
I assisted Tresa Endo, RN with questions and explanation of care plan.  Eda H Royal  Interpreter.

## 2014-03-02 NOTE — Lactation Note (Signed)
This note was copied from the chart of Cassie Thornton. Lactation Consultation Note  Patient Name: Cassie Mariyah Gaynelle Pastrana [id number] Date: 03/02/2014 Reason for consult: Initial assessment;Difficult latch;Late preterm infant;Infant < 6lbs  This is mom's first baby and her baby is 36 weeks/2 days and less than 6 pounds at birth.  Nurse has given parents the LPI handout in English but neither parent able to read or speak any [place].  LC speaks Spanish and reviews guidelines for feeding their LPI baby  at least q3h for at least 10-15 minutes on at least one breast.  Formula (22 cal) has been provided by nurse with instructions to breastfeed first and supplement with 5-10 ml's after nursing if needed.  Mom has copious flow of colostrum and baby is able to latch in football position on (L) breast with brief assistance of LC.  LC showed FOB how to assist and stressed need for mom to support her breast and compress breast tissue to help baby grasp areola deeply.  Rhythmical sucking and intermittent swallows noted for >8 minutes.  LC encouraged STS, cue feedings and pointed out the Spanish resources (Baby and Me,pp9,14 and 20-25) as well as [facility] [employer] and llli website for Spanish information on breastfeeding.  Maternal Data Formula Feeding for Exclusion: No Has patient been taught Hand Expression?: Yes (LC demonstrated and colostrum flows easily) Does the patient have breastfeeding experience prior to this delivery?: No  Feeding Feeding Type: Breast Fed Length of feed: 10 min (remained latched after 8 minutes and sucking bursts and swallows noted)  LATCH Score/Interventions Latch: Grasps breast easily, tongue down, lips flanged, rhythmical sucking. (difficult in cradle, then tried cross-cradle; finally latched in football)  Audible Swallowing: A few with stimulation Intervention(s): Skin to skin;Hand expression Intervention(s): Hand expression;Alternate breast massage;Skin to  skin  Type of Nipple: Everted at rest and after stimulation  Comfort (Breast/Nipple): Soft / non-tender     Hold (Positioning): Assistance needed to correctly position infant at breast and maintain latch. Intervention(s): Breastfeeding basics reviewed;Support Pillows;Position options;Skin to skin (recommend football position because baby is small and mom's breasts are large/soft)  LATCH Score: 8 (LC assisted and observed)  Lactation Tools Discussed/Used   STS, hand expression, cue feedings Supplemental guidelines for LPI after breastfeeding, as needed  Signs of proper latch Various positions demonstrated  Consult Status Consult Status: Follow-up Date: 03/03/14 Follow-up type: In-patient    Warrick Parisian [facility] 03/02/2014, 9:13 PM

## 2014-03-03 NOTE — Lactation Note (Signed)
This note was copied from the chart of Cassie Thornton. Lactation Consultation Note  Patient Name: Cassie Thornton Today's Date: 03/03/2014   [facility] spoke at length with this mom and baby's nurse, RN, Debbie who has worked with her extensively with baby and mom, using Spanish interpreter and ensuring that baby is latching well for at least 10 minutes, then receiving formula supplement (per LPI guidelines).  DEBP has been provided with instructions by RN and interpreter to preserve mom's milk supply while monitoring elevated bilirubin and ensuring baby is feeding sufficiently, due to low birth weight.  Maternal Data    Feeding    LATCH Score/Interventions           LATCH score=8 per LC last evening but none assessed yet today           Lactation Tools Discussed/Used   Discussed plan with RN, Veneda Melter: breastfeed first (10-15 minutes), supplement w/ebm or formula, DEBP 15 minutes either before or after breastfeeding, depending on baby's cues  Consult Status   LC to follow-up tomorrow   Lynda Rainwater 03/03/2014, 9:18 PM

## 2014-03-03 NOTE — Progress Notes (Signed)
Checked on patient needs also ordered patient dinner and breakfast Spanish interpreter - Joselyn Glassman

## 2014-03-03 NOTE — Progress Notes (Signed)
Checked on patient needs and ordered patient a snack. Spanish Interpreter -Joselyn Glassman

## 2014-03-03 NOTE — Progress Notes (Signed)
Checked on patient needs.  °Spanish Interpreter - Benita Sanchez °

## 2014-03-03 NOTE — Progress Notes (Signed)
Paged to the room to assist patient with interpretation of order with hospital photographer.  Spanish Interpreter -Joselyn Glassman

## 2014-03-03 NOTE — Progress Notes (Signed)
Post Partum Day 1 Subjective: Eating, drinking, voiding, ambulating well.  +flatus.  Lochia and pain wnl.  Denies dizziness, lightheadedness, or sob. No complaints.   Objective: Blood pressure 105/54, pulse 79, temperature 98 F (36.7 C), temperature source Oral, resp. rate 18, height  (1.448 m), weight 76.658 kg (169 lb), last menstrual period 06/21/2013, unknown if currently breastfeeding.  Physical Exam:  General: alert, cooperative and no distress Lochia: appropriate Uterine Fundus: firm Incision: n/a DVT Evaluation: No evidence of DVT seen on physical exam. Negative Homan's sign. No cords or calf tenderness. No significant calf/ankle edema.   Recent Labs  03/01/14 0830  HGB 11.7*  HCT 35.9*    Assessment/Plan: Plan for discharge tomorrow, Breastfeeding and Contraception depo  No circumcision   LOS: 2 days   Marge Duncans 03/03/2014, 9:53 AM

## 2014-03-03 NOTE — Progress Notes (Signed)
I stopped by patients room to check on her needs around midnight, Jan Rn was there trying to communicate with the patient, I assisted her with some questions about feeding . At 5:30 am I assisted Jan RN with some upgrade about feeding sheet, and explanation about PKU,bilirubin, and hepatitis Vaccine, by Suzi Roots

## 2014-03-03 NOTE — Progress Notes (Signed)
Assisted RN with interpretation of baby assessment. °Spanish Interpreter - Benita Sanchez °

## 2014-03-04 MED ORDER — OXYCODONE-ACETAMINOPHEN 5-325 MG PO TABS: 1.0000 | ORAL_TABLET | ORAL | Status: AC | PRN

## 2014-03-04 MED ORDER — IBUPROFEN 600 MG PO TABS: 600.0000 mg | ORAL_TABLET | Freq: Four times a day (QID) | ORAL | Status: AC

## 2014-03-04 NOTE — Progress Notes (Signed)
Checked on patients needs.  Mother and baby were still sleeping.  Spanish Interpreter

## 2014-03-04 NOTE — Progress Notes (Signed)
Checked on patients needs and to make sure her medications were accessible due to late hours.  Spouse had just returned with medications.  I also assisted with interpretation with patient and RN concerning baby's blood results.  Spanish Interpreter - Joselyn Glassman

## 2014-03-04 NOTE — Progress Notes (Signed)
Check on patients needs mother and baby were sleeping.  Spanish Interpreter - Joselyn Glassman

## 2014-03-04 NOTE — Discharge Summary (Signed)
Obstetric Discharge Summary Reason for Admission: onset of labor Prenatal Procedures: ultrasound Intrapartum Procedures: spontaneous vaginal delivery Postpartum Procedures: none Complications-Operative and Postpartum: none HEMOGLOBIN  Date Value Ref Range Status  03/01/2014 11.7* 12.0 - 15.0 g/dL Final  40/98/1191 47.8 g/dL Final   HCT  Date Value Ref Range Status  03/01/2014 35.9* 36.0 - 46.0 % Final  09/25/2013 40 % Final    Physical Exam:  General: alert, cooperative, appears stated age and no distress Lochia: appropriate Uterine Fundus: firm Incision: n/a DVT Evaluation: No evidence of DVT seen on physical exam. Negative Homan's sign. No cords or calf tenderness. No significant calf/ankle edema.  Discharge Diagnoses: Premature labor  Discharge Information: Date: 03/04/2014 Activity: pelvic rest Diet: routine Medications: PNV, Ibuprofen and Percocet Condition: stable and improved Instructions: refer to practice specific booklet Discharge to: home   Newborn Data: Live born female  Birth Weight: 5 lb 2.9 oz (2350 g) APGAR: 7, 9  Home with mother.  Cassie Thornton 03/04/2014, 9:44 AM

## 2014-03-04 NOTE — Progress Notes (Signed)
Assisted RN with interpretation of patient questions concerning mothers medications.  Spanish Interpreter - Joselyn Glassman

## 2014-03-04 NOTE — Progress Notes (Signed)
Assisted RN with interpretation of updated report on baby to parents.  Ordered moms meals and snack Spanish Interpreter -Joselyn Glassman

## 2014-03-05 ENCOUNTER — Other Ambulatory Visit: Payer: Self-pay

## 2014-03-05 ENCOUNTER — Ambulatory Visit: Payer: Self-pay

## 2014-03-05 NOTE — Lactation Note (Signed)
This note was copied from the chart of Cassie Thornton. Lactation Consultation Note; Follow up visit with Eda translating for me. Mom reports that baby has been nursing well but she thinks her nipple is too big for the baby's mouth. Reports pumping is helping soften breasts. Reports one side is not pumping as well as the other. Assisted mom with pump and she is pumping when I left room. States it feels fine now- no pain and both sides feel the same. Encouraged to nurse or pump q 2-3 hours to prevent engorgement. No questions at present. To call prn  Patient Name: Cassie Thornton [id number]'Z Date: 03/05/2014 Reason for consult: Follow-up assessment;Infant < 6lbs;Late preterm infant   Maternal Data Formula Feeding for Exclusion: No Does the patient have breastfeeding experience prior to this delivery?: Yes  Feeding   LATCH Score/Interventions                Lactation Tools Discussed/Used Tools: Pump Breast pump type: Double-Electric Breast Pump Pump Review: Setup, frequency, and cleaning;Milk Storage Initiated by:: Peri Jefferson RN Date initiated:: 03/05/14 (mom was using hand pump. had DEBP in rm.)   Consult Status Consult Status: PRN Date: 03/05/14 Follow-up type: In-patient    Pamelia Hoit 03/05/2014, 8:27 AM

## 2014-03-05 NOTE — Lactation Note (Signed)
This note was copied from the chart of Cassie Kyndra Drucie Opitz. Lactation Consultation Note F/u d/t LPI less than 6 lbs. And DPT. Noted mom engorged. Hand pumped 60ml. Instructed to use the DEBP in her rm. That had been set up. FOB translated that she had just been using the hand pump. Demonstrated how to use DEBP again. ICE bag applied to breast after DEBP 60 ml. Mom had just BF at 4 FOB stated for 10 min. Then she give it bottle of breast milk. Instructed not to give any more formula d/t mom milk is in. Mom is experienced BF. Baby weight is up and feeding well. Mom given bottles and instructed on set up and cleaning of DEBP and storage of milk.  Patient Name: Cassie Thornton OZHYQ'M Date: 03/05/2014 Reason for consult: Follow-up assessment;Breast/nipple pain;Hyperbilirubinemia;Infant < 6lbs;Late preterm infant   Maternal Data    Feeding    LATCH Score/Interventions       Type of Nipple: Everted at rest and after stimulation  Comfort (Breast/Nipple): Engorged, cracked, bleeding, large blisters, severe discomfort Problem noted: Engorgment Intervention(s): Ice;Other (comment) (DEBP)           Lactation Tools Discussed/Used Tools: Pump Breast pump type: Double-Electric Breast Pump Pump Review: Setup, frequency, and cleaning;Milk Storage Initiated by:: Peri Jefferson RN Date initiated:: 03/05/14 (mom was using hand pump. had DEBP in rm.)   Consult Status Consult Status: Follow-up Date: 03/05/14 Follow-up type: In-patient    [name], Diamond Nickel 03/05/2014, 6:18 AM

## 2014-03-05 NOTE — Progress Notes (Signed)
Post discharge chart review completed.  

## 2014-03-06 ENCOUNTER — Ambulatory Visit: Payer: Self-pay

## 2014-03-06 NOTE — Lactation Note (Signed)
This note was copied from the chart of Cassie Thornton. Lactation Consultation Note  Kennyth [employer] Interpreter [phone]#[id number] Baby 36/2 and 5lb 2.5 oz. P1 Baby cueing.  Encouraged mother to breastfeed baby.  Pump parts packed in car for discharge. Mother needed some assistance latching baby.  Had to explain where to place hands, not very demonstrative w/baby. Moved from cradle to football.  Baby latched well in football.  Encouraged mother to massage her breasts repeatedly to keep baby active. Mother was able to hand express good flow of transitional breastmilk and has been pumping 20-75 ml w DEBP. Mother is giving breastmilk to baby in bottle and breastfeeding. Reviewed to complete breast and bottle feeding within 30 min. Mother has hand pump.  [place]   Reviewed Baby & Me booklet chart for monitoring voids/stools and engorgement care. Made outpt appt Wed 1/13 1pm.    Patient Name: Cassie Carolan Drucie OpitzLopez Thornton JXBJY'NToday's Date: 03/06/2014 Reason for consult: Follow-up assessment   Maternal Data    Feeding Feeding Type: Breast Fed Length of feed: 15 min  LATCH Score/Interventions Latch: Grasps breast easily, tongue down, lips flanged, rhythmical sucking.  Audible Swallowing: A few with stimulation  Type of Nipple: Everted at rest and after stimulation  Comfort (Breast/Nipple): Soft / non-tender     Hold (Positioning): Assistance needed to correctly position infant at breast and maintain latch.  LATCH Score: 8  Lactation Tools Discussed/Used     Consult Status Consult Status: Complete    Hardie PulleyBerkelhammer, Keyry Iracheta Boschen 03/06/2014, 12:01 PM

## 2014-03-12 ENCOUNTER — Ambulatory Visit (HOSPITAL_COMMUNITY): Payer: Self-pay

## 2014-03-14 ENCOUNTER — Ambulatory Visit (HOSPITAL_COMMUNITY)
Admission: RE | Admit: 2014-03-14 | Discharge: 2014-03-14 | Disposition: A | Discharge status: Home / Self Care | Payer: Self-pay | Source: Ambulatory Visit

## 2014-03-14 ENCOUNTER — Ambulatory Visit (HOSPITAL_COMMUNITY): Payer: Self-pay

## 2014-03-14 NOTE — Lactation Note (Signed)
Lactation Consult   [facility]. Baby is doing very well; mom is feeding baby at breast giving bottle w/EBM.  Mom is able to pump 4oz q2-3 hrs. Excellent output; baby's stools are yellow. Mom has a peds appt in a couple of hours & baby is satiated from previous feed.  Mom is not sure why she's presenting for the appt; it was made for her before hospital d/c.  She had called yesterday to try & cancel it, but she was told she had to come in.  Considering the appt may have been made without her full consent (and baby is well), I told Mom she didn't have to keep this appt.   However, Mom given our phone # in case she does want to make an LC appt at another time.  Mom aware that if she does come for an LC appt, an appt typically takes 1hr or more and that baby needs to be ready to eat upon arrival.  Interpreter present for entire conversation.  Parents pleased.  Breast milk storage guidelines reviewed per their request.

## 2014-04-03 ENCOUNTER — Encounter: Payer: Self-pay | Admitting: Obstetrics & Gynecology

## 2014-04-05 ENCOUNTER — Ambulatory Visit (INDEPENDENT_AMBULATORY_CARE_PROVIDER_SITE_OTHER): Payer: Self-pay | Admitting: Family Medicine

## 2014-04-05 ENCOUNTER — Encounter: Payer: Self-pay | Admitting: Family Medicine

## 2014-04-05 VITALS — BP 142/86 | HR 75 | Wt 154.5 lb

## 2014-04-05 DIAGNOSIS — O24419 Gestational diabetes mellitus in pregnancy, unspecified control: Secondary | ICD-10-CM

## 2014-04-05 NOTE — Progress Notes (Signed)
Subjective:     Cassie Thornton is a 34 y.o. female who presents for a postpartum visit. She is 4 weeks postpartum following a spontaneous vaginal delivery. I have fully reviewed the prenatal and intrapartum course. The delivery was at 36 gestational weeks 2/2 PPROM. Outcome: spontaneous vaginal delivery. Anesthesia: none. Postpartum course has been uncomplicated. Baby's course has been uncomplicated, doing well, small initially but now gaining weight. Baby is feeding by breast. Bleeding no bleeding. Bowel function is normal. Bladder function is normal. Patient is not sexually active. Contraception method is Depo-Provera injections. Postpartum depression screening: negative.  Back pain: intermittent, nothing alleviates/worsens, has not taken anything.  The following portions of the patient's history were reviewed and updated as appropriate: allergies, current medications, past family history, past medical history, past social history, past surgical history and problem list.   Delivery Note Patient is 34 y.o. [id number] admitted with premature rupture of membranes, A2DM on glyburide with recent glucose prior to delivery of 167 after having had apple juice, glucose otherwise wnl  At 7:11 AM a viable female was delivered via Vaginal, Spontaneous Delivery (Presentation: Left Occiput Anterior). APGAR: 7, 9; weight 5 lb 2.9 oz (2350 g).  Placenta status: Intact, Spontaneous. Cord: 3 vessels with the following complications: None.  Anesthesia: None  Episiotomy: None Lacerations: None Suture Repair: n/a Est. Blood Loss (mL): 100  Mom to postpartum. Baby to Couplet care / Skin to Skin.  Review of Systems Pertinent items are noted in HPI.   Pap neg 09/25/2013 Objective:    BP 142/86 mmHg  Pulse 75  Wt 154 lb 8 oz (70.081 kg)  Breastfeeding? Yes  General:  alert and cooperative     Lungs: normal effort, no distress  Heart:  normal rate  Abdomen: soft, nontender        Assessment:     Normal postpartum exam. Pap smear not done at today's visit.   Plan:    1. Contraception: none currently, requests nexplanon => referred to HD for nexplanon 2. Elevated BP today: likely has chronic hypertension, previous blood pressures reviewed and within range. 3. Follow up in:  as needed.   4. 2h gtt => will schedule for Thursday morning 5. Preterm delivery: advised of possibility of need for 17-p with future pregnancies or other interventions

## 2014-04-05 NOTE — Progress Notes (Signed)
Pt interested in [facility], [facility]

## 2014-04-12 ENCOUNTER — Other Ambulatory Visit: Payer: Self-pay

## 2014-04-12 DIAGNOSIS — O24439 Gestational diabetes mellitus in the puerperium, unspecified control: Secondary | ICD-10-CM

## 2014-04-13 LAB — GLUCOSE TOLERANCE, 2 HOURS
Glucose, 2 hour: 108 mg/dL (ref 70–139)
Glucose, Fasting: 92 mg/dL (ref 70–99)

## 2014-05-03 ENCOUNTER — Encounter: Payer: Self-pay | Admitting: Obstetrics & Gynecology

## 2016-01-25 IMAGING — US US OB COMP +14 WK
1 series · 12 of 28 positions shown · non-contrast
Comparison: none

[Series 1: us ob comp +14 wk · 0.23mm/px · 12 of 73 slices shown]
[im 3/73]
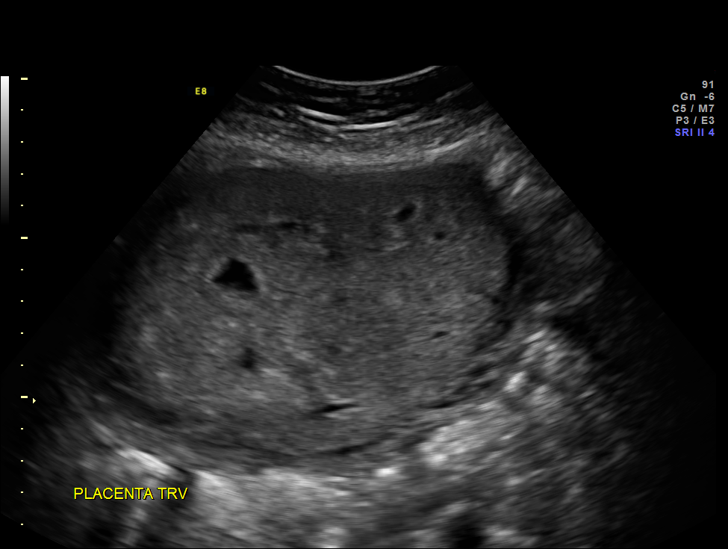
[im 9/73]
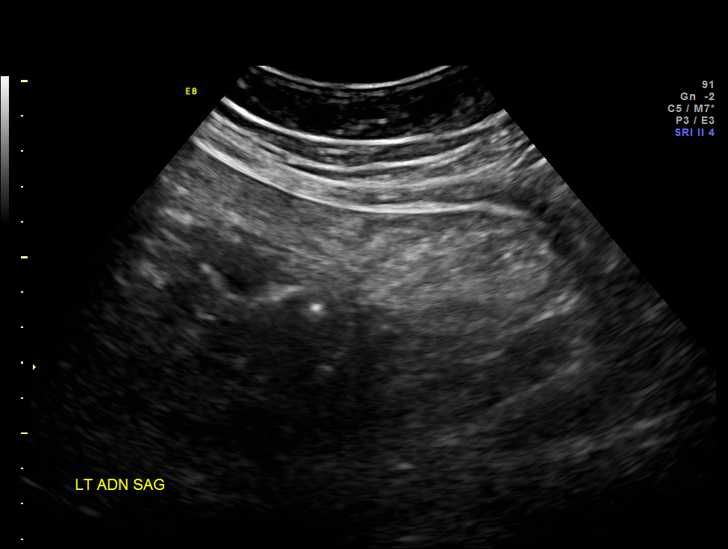
[im 14/73]
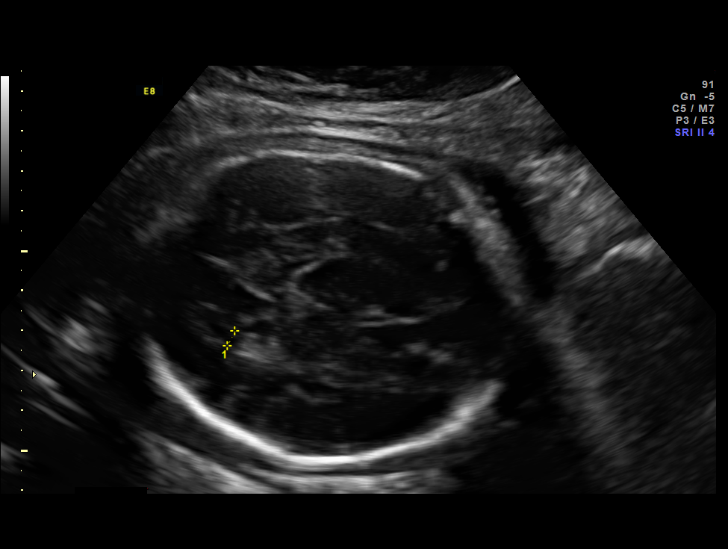
[im 22/73]
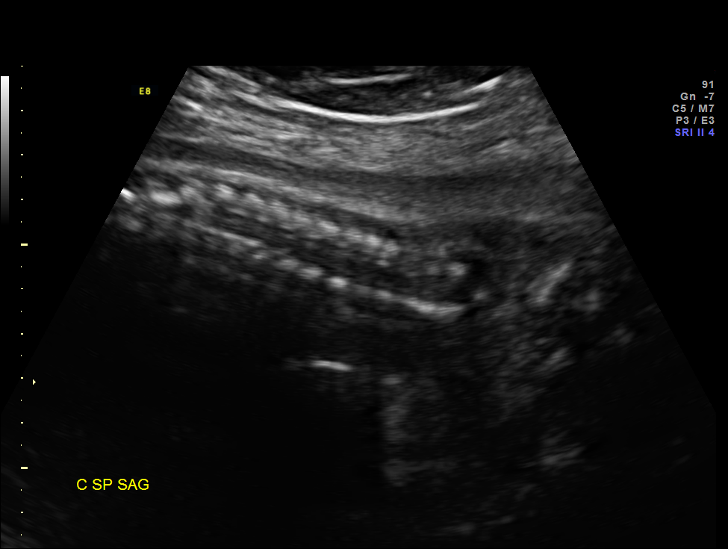
[im 27/73]
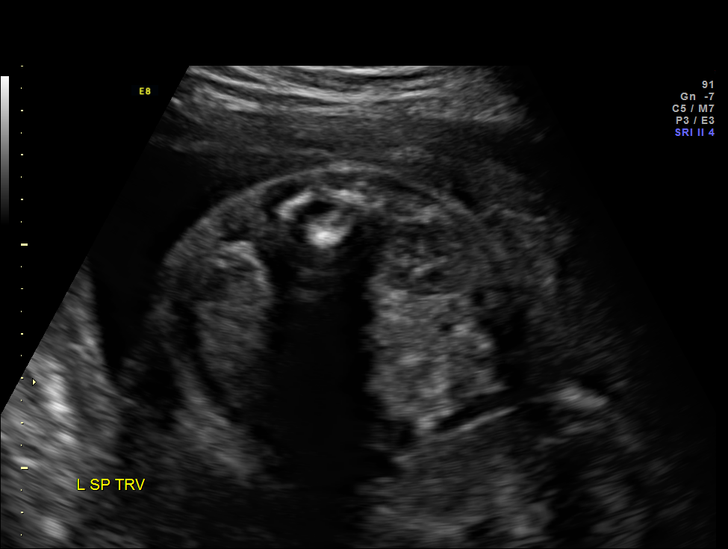
[im 33/73]
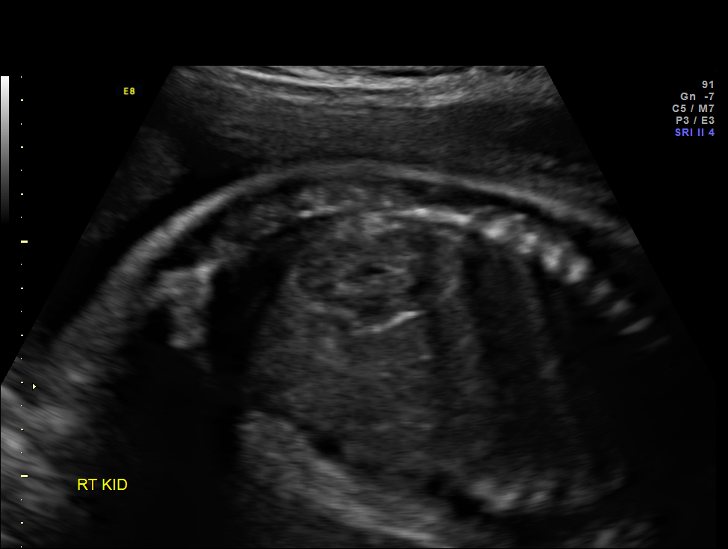
[im 41/73]
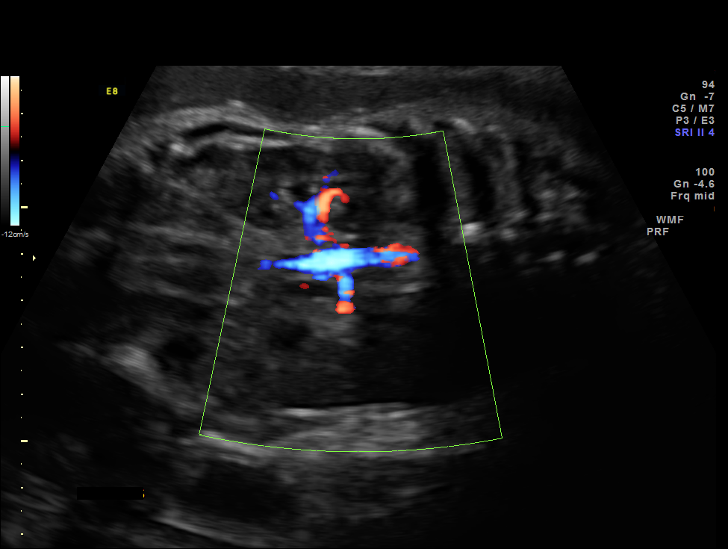
[im 46/73]
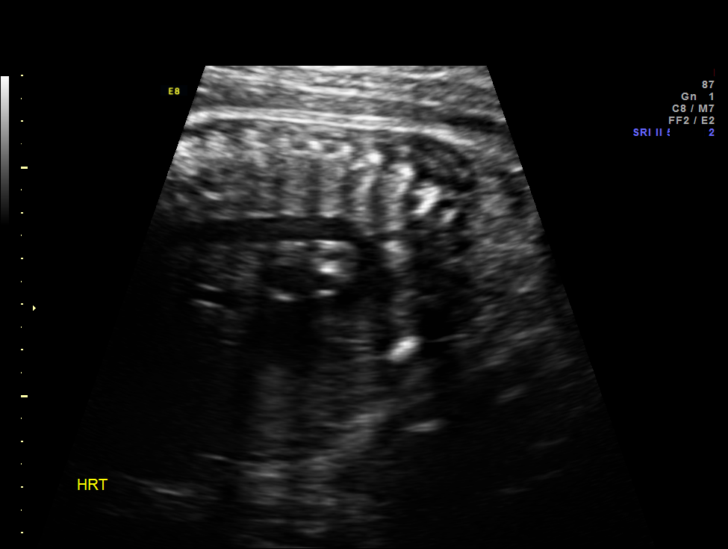
[im 51/73]
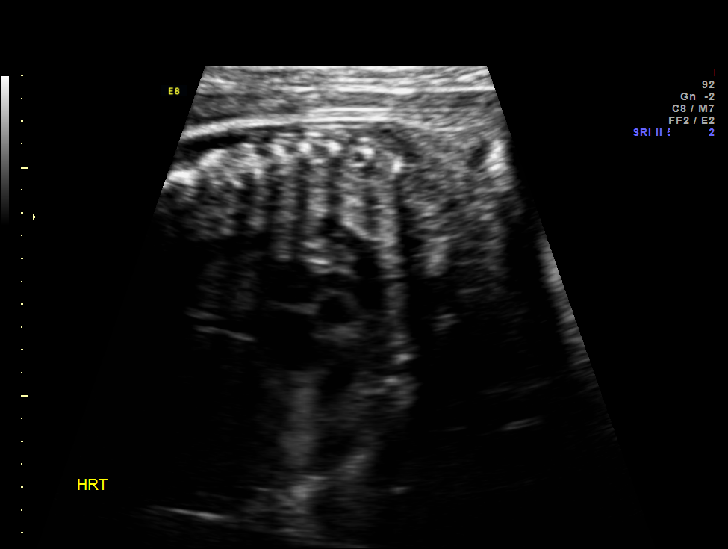
[im 59/73]
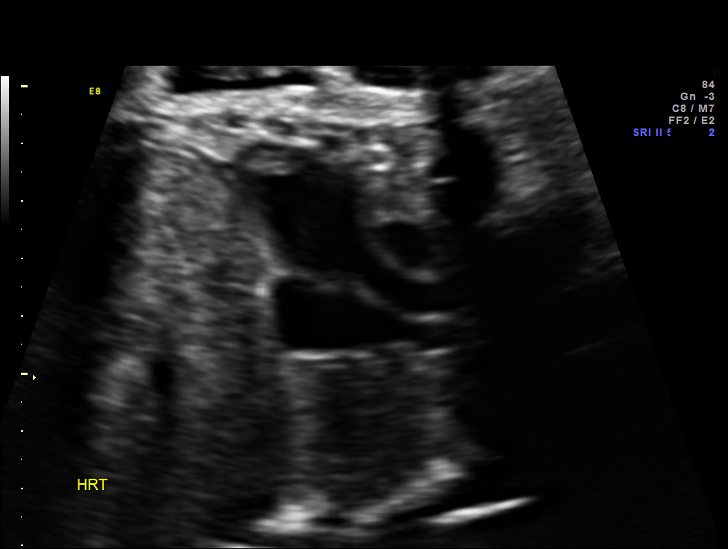
[im 65/73]
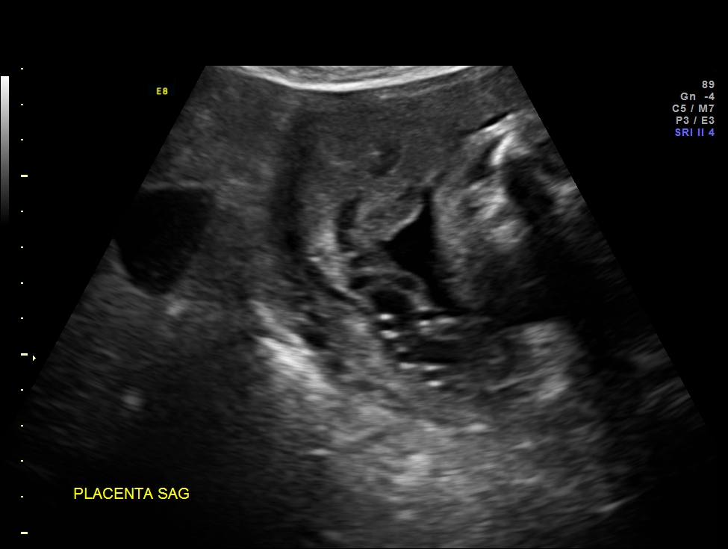
[im 70/73]
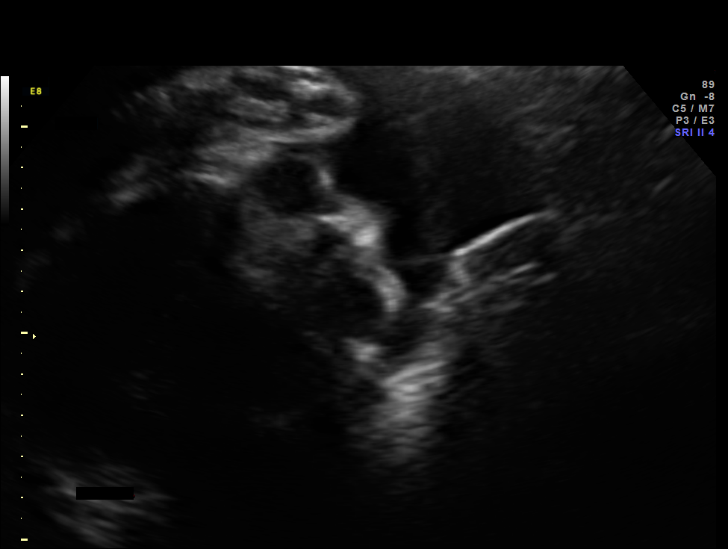

[12 of 28 positions shown; findings below may reference images not displayed]

OBSTETRICS REPORT
                      (Signed Final 02/10/2014 [DATE])

             ERXLEBEN

Service(s) Provided

 US OB COMP + 14 WK                                    76805.1
Indications

 Advanced maternal age primigravida 35+, third
 trimester
 32 weeks gestation of pregnancy
Fetal Evaluation

 Num Of Fetuses:    1
 Fetal Heart Rate:  141                          bpm
 Cardiac Activity:  Observed
 Presentation:      Cephalic
 Placenta:          Fundal, above cervical os
 P. Cord            Marginal insertion
 Insertion:

 Amniotic Fluid
 AFI FV:      Subjectively within normal limits
 AFI Sum:     13.45   cm       43  %Tile
Biophysical Evaluation

 Amniotic F.V:   Within normal limits       F. Tone:         Observed
 F. Movement:    Observed                   Score:           [DATE]
 F. Breathing:   Observed
Biometry

 BPD:       75  mm     G. Age:  30w 1d                CI:        71.78   70 - 86
                                                      FL/HC:      21.3   19.1 -

 HC:     281.8  mm     G. Age:  30w 6d       < 3  %   HC/AC:      1.05   0.96 -

 AC:     267.9  mm     G. Age:  30w 6d       14   %   FL/BPD:     79.9   71 - 87
 FL:      59.9  mm     G. Age:  31w 1d       13   %   FL/AC:      22.4   20 - 24
 HUM:     53.5  mm     G. Age:  31w 1d       30   %

 Est. FW:    1334   gm   3 lb 11 oz      30  %
Gestational Age
 LMP:           32w 2d        Date:  06/21/13                 EDD:   03/28/14
 U/S Today:     30w 5d                                        EDD:   04/08/14
 Best:          32w 2d     Det. By:  LMP  (06/21/13)          EDD:   03/28/14
Anatomy

 Cranium:          Appears normal         Aortic Arch:      Appears normal
 Fetal Cavum:      Appears normal         Ductal Arch:      Appears normal
 Ventricles:       Appears normal         Diaphragm:        Appears normal
 Choroid Plexus:   Appears normal         Stomach:          Appears normal, left
                                                            sided
 Cerebellum:       Appears normal         Abdomen:          Appears normal
 Posterior Fossa:  Appears normal         Abdominal Wall:   Not well visualized
 Nuchal Fold:      Not applicable (>20    Cord Vessels:     Appears normal (3
                   wks GA)                                  vessel cord)
 Face:             Orbits nl; profile not Kidneys:          Appear normal
                   well visualized
 Lips:             Appears normal         Bladder:          Appears normal
 Heart:            Appears normal         Spine:            Appears normal
                   (4CH, axis, and
                   situs)
 RVOT:             Appears normal         Lower             Appears normal
                                          Extremities:
 LVOT:             Appears normal         Upper             Appears normal
                                          Extremities:

 Other:  Male gender.
Cervix Uterus Adnexa

 Cervix:       Not visualized (advanced GA >10wks)
Impression

 Singleton intrauterine pregnancy
 Normal appearing fetal anatomy on sonographic fetal survey
 Normal appearing fetal growth and amniotic fluid volume
 Cephalic presentation
 Fundal placenta without evidence of previa although a
 marginal umbilical cord insertion was noted
 BPP [DATE]

 Report generated delayed as system was down at time of
 ultrasound
Recommendations

 Oral controlled gestational diabetes
 - She did not have her glucose log today, but reports normal
 sounding values
 - Twice weekly antenatal testing
 - Serial 4 week sonography for evaluation of fetal growth
 - Consider delivery at 39 weeks if still undelivered or as
 clinically indicated

 VAI HUNG SUEN with us.  Please do not hesitate to contact
                  Lois, Icela

## 2016-02-11 IMAGING — US US FETAL BPP W/O NONSTRESS
1 series · 13 of 28 positions shown · non-contrast
Comparison: none

[Series 1: us fetal bpp w/o nonstress · non-contrast · 30 acquisitions, 13 frames shown]
[im 2/30]
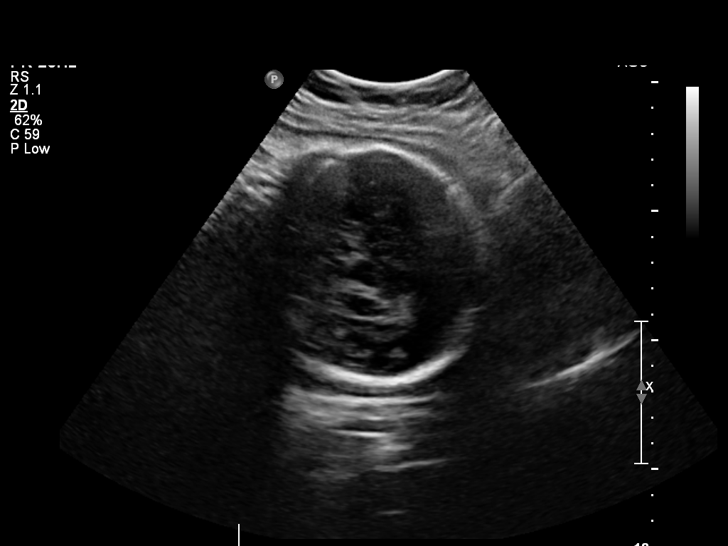
[im 4/30]
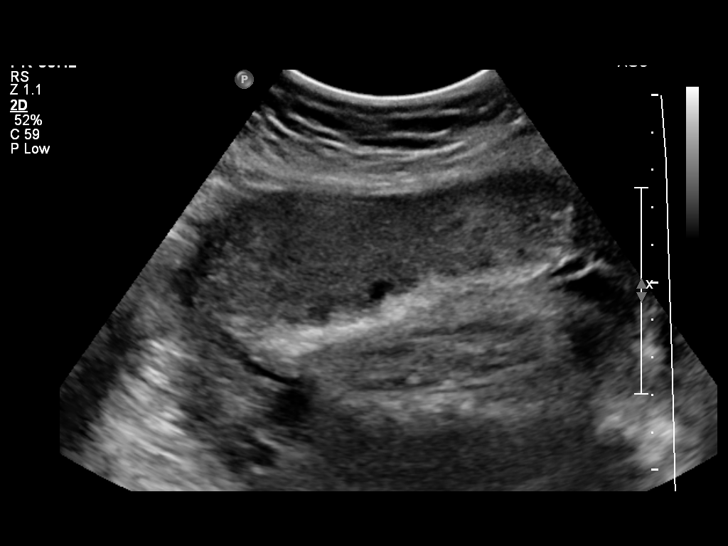
[im 6/30]
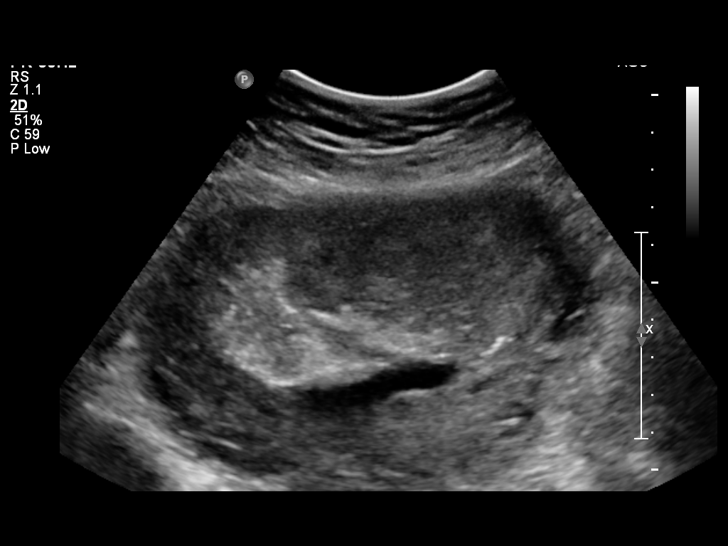
[im 8/30]
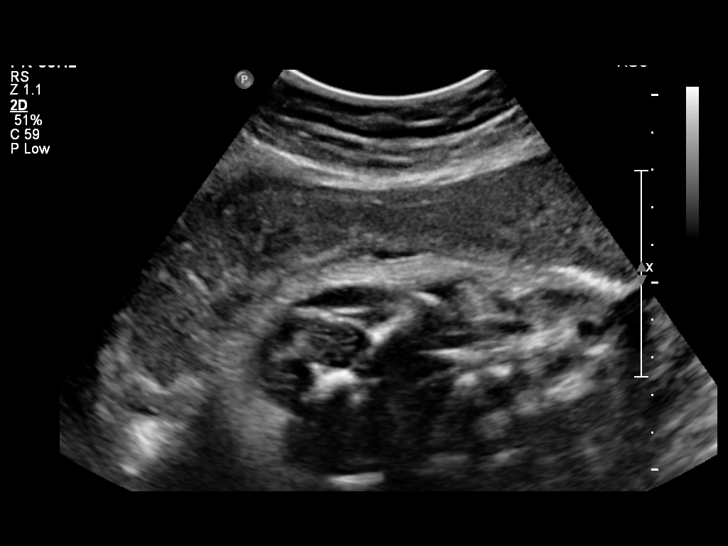
[im 10/30]
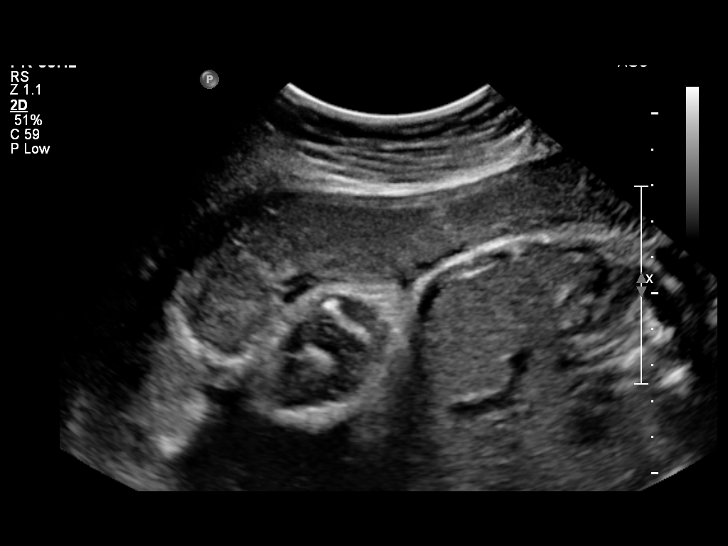
[im 12/30]
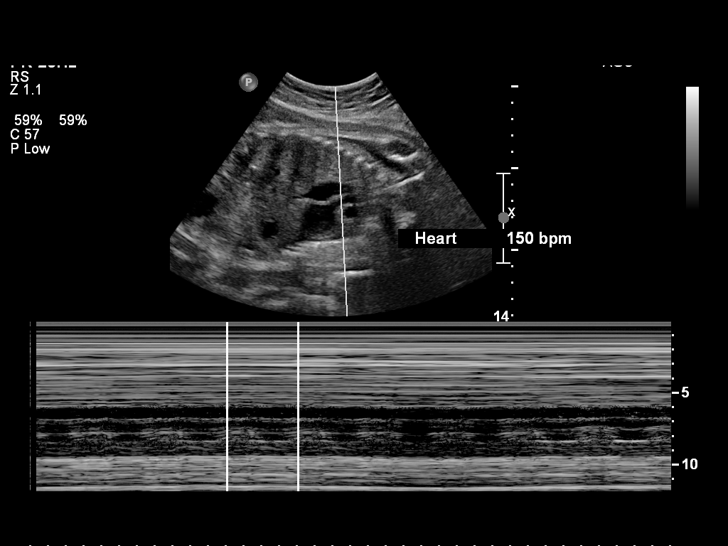
[im 16/30]
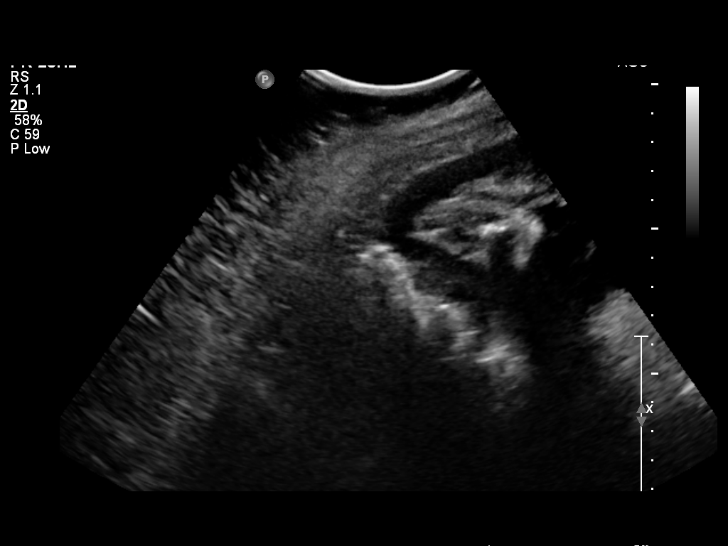
[im 18/30]
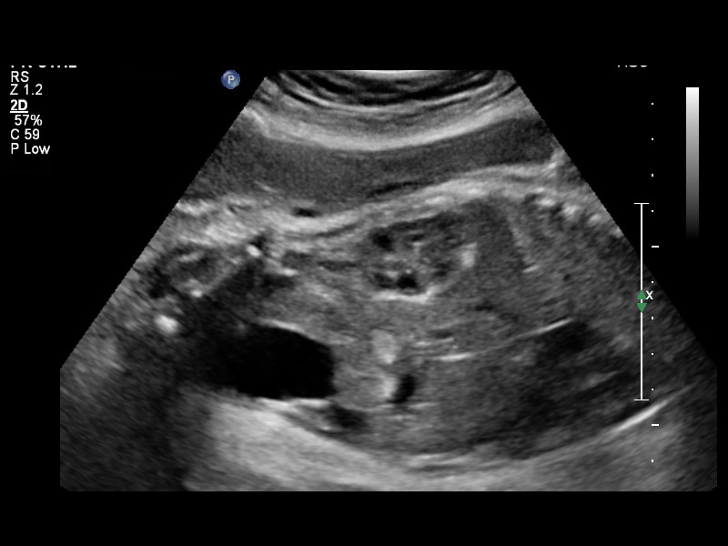
[im 20/30]
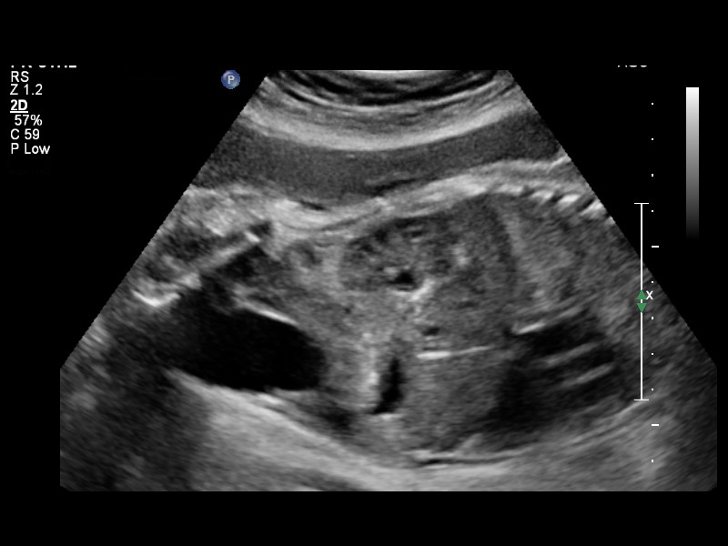
[im 22/30]
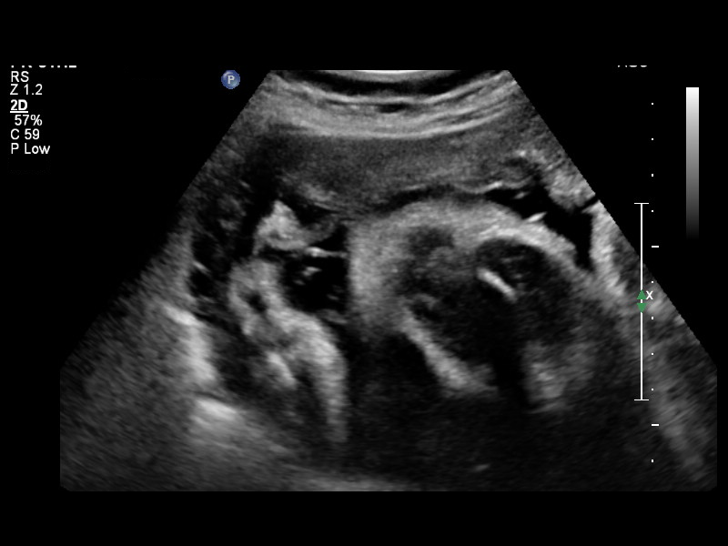
[im 24/30]
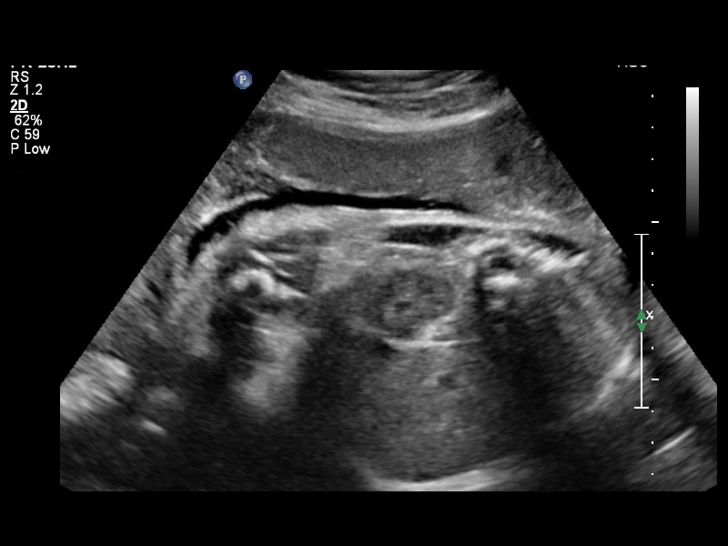
[im 26/30]
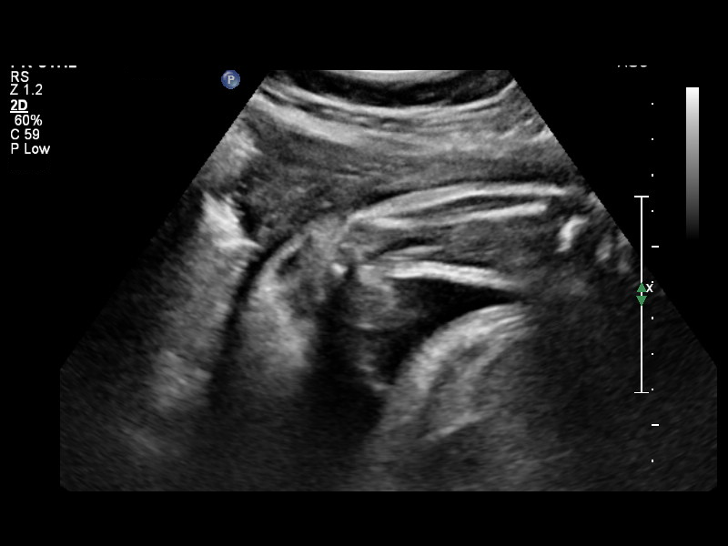
[im 28/30]
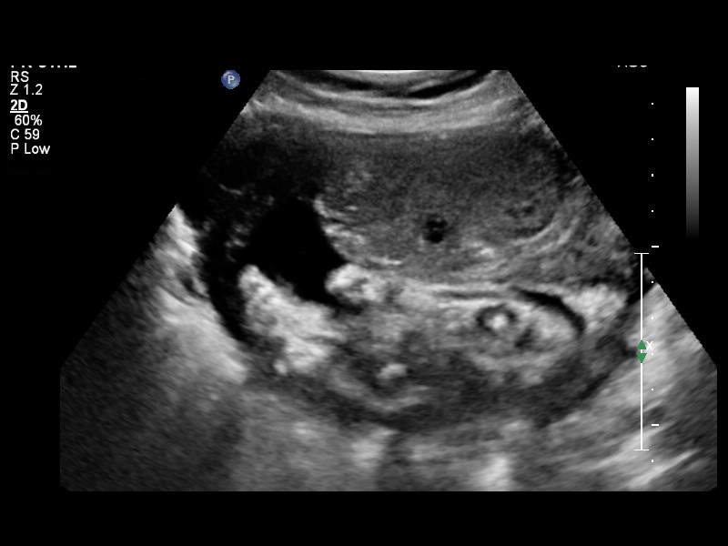

[13 of 28 positions shown; findings below may reference images not displayed]

OBSTETRICS REPORT
                      (Signed Final 02/20/2014 [DATE])

             ARABORI

Service(s) Provided

Indications

 Advanced maternal age primigravida 35+, third
 trimester
 Gestational diabetes in pregnancy, insulin
 controlled
 34 weeks gestation of pregnancy
Fetal Evaluation

 Num Of Fetuses:    1
 Fetal Heart Rate:  150                          bpm
 Cardiac Activity:  Observed
 Presentation:      Cephalic
 Placenta:          Anterior, above cervical os

 Amniotic Fluid
 AFI FV:      Subjectively within normal limits
                                              Larg Pckt:   4.63  cm
Biophysical Evaluation

 Amniotic F.V:   Pocket => 2 cm two         F. Tone:         Observed
                 planes
 F. Movement:    Observed                   Score:           [DATE]
 F. Breathing:   Observed
Gestational Age

 LMP:           34w 5d        Date:  06/21/13                 EDD:    03/28/14
 Best:          34w 5d     Det. By:  LMP  (06/21/13)          EDD:    03/28/14
Cervix Uterus Adnexa

 Cervix:       Not visualized (advanced GA >20wks)
 Uterus:       No abnormality visualized.
 Left Ovary:    Not visualized.
 Right Ovary:   Not visualized.

 Adnexa:     No abnormality visualized. No adnexal mass visualized.
Impression

 SIUP at 34+5 weeks
 Normal amniotic fluid volume
 BPP [DATE]
Recommendations

 Continue twice weekly NSTs with weekly AFIs

 YANISE LUKA with us.  Please do not hesitate to contact
